# Patient Record
Sex: Female | Born: 1985 | Race: Black or African American | Hispanic: No | Marital: Single
Health system: Southern US, Community
[De-identification: ages and names within clinical notes are randomized; demographics above are authoritative.]

## PROBLEM LIST (undated history)

## (undated) DIAGNOSIS — M1990 Unspecified osteoarthritis, unspecified site: Secondary | ICD-10-CM

---

## 2019-03-28 ENCOUNTER — Encounter (HOSPITAL_COMMUNITY): Payer: Self-pay | Admitting: *Deleted

## 2019-03-28 ENCOUNTER — Other Ambulatory Visit: Payer: Self-pay

## 2019-03-28 ENCOUNTER — Emergency Department (HOSPITAL_COMMUNITY)
Admission: EM | Admit: 2019-03-28 | Discharge: 2019-03-28 | Disposition: A | Discharge status: Home / Self Care | Payer: No Typology Code available for payment source | Attending: Emergency Medicine | Admitting: Emergency Medicine

## 2019-03-28 ENCOUNTER — Emergency Department (HOSPITAL_COMMUNITY): Payer: No Typology Code available for payment source

## 2019-03-28 DIAGNOSIS — M545 Low back pain, unspecified: Secondary | ICD-10-CM

## 2019-03-28 DIAGNOSIS — Y93I9 Activity, other involving external motion: Secondary | ICD-10-CM | POA: Insufficient documentation

## 2019-03-28 DIAGNOSIS — Y92410 Unspecified street and highway as the place of occurrence of the external cause: Secondary | ICD-10-CM | POA: Diagnosis not present

## 2019-03-28 DIAGNOSIS — S139XXA Sprain of joints and ligaments of unspecified parts of neck, initial encounter: Secondary | ICD-10-CM

## 2019-03-28 DIAGNOSIS — M25561 Pain in right knee: Secondary | ICD-10-CM | POA: Diagnosis not present

## 2019-03-28 DIAGNOSIS — Y999 Unspecified external cause status: Secondary | ICD-10-CM | POA: Diagnosis not present

## 2019-03-28 DIAGNOSIS — M25562 Pain in left knee: Secondary | ICD-10-CM | POA: Diagnosis not present

## 2019-03-28 DIAGNOSIS — S3992XA Unspecified injury of lower back, initial encounter: Secondary | ICD-10-CM | POA: Diagnosis present

## 2019-03-28 HISTORY — DX: Unspecified osteoarthritis, unspecified site: M19.90

## 2019-03-28 MED ORDER — CYCLOBENZAPRINE HCL 10 MG PO TABS: 10.0000 mg | ORAL_TABLET | Freq: Two times a day (BID) | ORAL | 0 refills | Status: AC | PRN
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
## 2019-03-28 NOTE — ED Provider Notes (Signed)
Patient signed out to me pending x-rays.  Patient was in [facility] yesterday now complaining of right neck and shoulder pain.  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  This is likely having some impingement on the nerve root.  No acute fracture.  No acute subluxation or bony destruction.  Discussed findings with patient and need for further imaging.  Patient would like to follow-up with her primary doctor and get imaging as outpatient.  I believe this is very reasonable given that this is not an acute injury or finding.  Will discharge home with muscle relaxer.  Will have patient follow-up with PCP later this week.  I provided the patient with x-ray and x-ray results to take to her primary care physician.  Discussed signs that warrant reevaluation.  Patient agrees with plan.   Louanne Skye, MD 03/28/19 [phone]

## 2019-03-28 NOTE — ED Notes (Addendum)
pt states there is no chance she is pregnant
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
